# Patient Record
Sex: Female | Born: 1950 | Race: White | Hispanic: No | Marital: Married | Smoking: Never smoker
Health system: Southern US, Community
[De-identification: ages and names within clinical notes are randomized; demographics above are authoritative.]

---

## 2000-02-19 ENCOUNTER — Encounter: Payer: Self-pay | Admitting: Family Medicine

## 2000-02-19 ENCOUNTER — Encounter: Admission: RE | Admit: 2000-02-19 | Discharge: 2000-02-19 | Payer: Self-pay | Admitting: Family Medicine

## 2001-02-25 ENCOUNTER — Encounter: Admission: RE | Admit: 2001-02-25 | Discharge: 2001-02-25 | Payer: Self-pay | Admitting: Emergency Medicine

## 2001-02-25 ENCOUNTER — Encounter: Payer: Self-pay | Admitting: Emergency Medicine

## 2003-03-15 ENCOUNTER — Encounter (INDEPENDENT_AMBULATORY_CARE_PROVIDER_SITE_OTHER): Payer: Self-pay | Admitting: *Deleted

## 2003-03-15 ENCOUNTER — Ambulatory Visit (HOSPITAL_COMMUNITY): Admission: RE | Admit: 2003-03-15 | Discharge: 2003-03-15 | Payer: Self-pay | Admitting: Gastroenterology

## 2005-08-14 ENCOUNTER — Other Ambulatory Visit: Admission: RE | Admit: 2005-08-14 | Discharge: 2005-08-14 | Payer: Self-pay | Admitting: Emergency Medicine

## 2005-12-04 ENCOUNTER — Encounter: Admission: RE | Admit: 2005-12-04 | Discharge: 2005-12-04 | Payer: Self-pay | Admitting: Emergency Medicine

## 2005-12-18 ENCOUNTER — Encounter: Admission: RE | Admit: 2005-12-18 | Discharge: 2006-03-18 | Payer: Self-pay | Admitting: Emergency Medicine

## 2007-01-22 ENCOUNTER — Encounter: Admission: RE | Admit: 2007-01-22 | Discharge: 2007-01-22 | Payer: Self-pay | Admitting: Emergency Medicine

## 2007-05-14 ENCOUNTER — Encounter: Admission: RE | Admit: 2007-05-14 | Discharge: 2007-05-14 | Payer: Self-pay | Admitting: Family Medicine

## 2008-01-12 ENCOUNTER — Other Ambulatory Visit: Admission: RE | Admit: 2008-01-12 | Discharge: 2008-01-12 | Payer: Self-pay | Admitting: Emergency Medicine

## 2009-02-21 ENCOUNTER — Encounter: Admission: RE | Admit: 2009-02-21 | Discharge: 2009-02-21 | Payer: Self-pay | Admitting: Emergency Medicine

## 2010-08-17 NOTE — Op Note (Signed)
Alisha Jones, KASSEL                            ACCOUNT NO.:  [id number]   MEDICAL RECORD NO.:  [id number]                   PATIENT TYPE:  AMB   LOCATION:  ENDO                                 FACILITY:  [facility]   PHYSICIAN:  James L. Malon Kindle., M.D.          DATE OF BIRTH:  May 30, 1950   DATE OF PROCEDURE:  03/15/2003  DATE OF DISCHARGE:                                 OPERATIVE REPORT   PROCEDURE PERFORMED:  Colonoscopy with polypectomy.   ENDOSCOPIST:  Llana Aliment. Edwards, M.D.   MEDICATIONS:  Fentanyl 50 mcg, Versed 10 mg IV.   INDICATIONS FOR PROCEDURE:  Colon cancer screening.   INSTRUMENT USED:  Pediatric colonoscope.   DESCRIPTION OF PROCEDURE:  The procedure had been explained to the patient  and consent obtained.  With the patient in the left lateral decubitus  position, the pediatric adjustable Olympus colonoscope was inserted and  advanced.  We were able to advance easily to the cecum using a little  abdominal pressure.  The ileocecal valve and appendiceal orifice were seen.  The scope was withdrawn and the cecum, ascending colon, hepatic flexure,  transverse colon, descending and sigmoid colon were seen well.  In the  midsigmoid at approximately 30 cm a 0.75 cm sessile polyp was encountered  and removed with a snare and sucked through the scope.  There was no  bleeding.  No other polyps were seen in the sigmoid colon or in the rectum.  The scope was withdrawn.  The patient tolerated the procedure well.   ASSESSMENT:  Sigmoid colon poly snared, 211.3.   PLAN:  Check pathology.  Routine postpolypectomy instructions.                                               James L. Malon Kindle., M.D.    Waldron Session  D:  03/15/2003  T:  03/15/2003  Job:  [id number]   cc:   Oley Balm. Georgina Pillion, M.D.  [address]  [place]  [place] [address]  Fax: [phone]

## 2011-04-03 ENCOUNTER — Other Ambulatory Visit: Payer: Self-pay | Admitting: Family Medicine

## 2011-04-03 DIAGNOSIS — Z1231 Encounter for screening mammogram for malignant neoplasm of breast: Secondary | ICD-10-CM

## 2011-04-19 ENCOUNTER — Ambulatory Visit
Admission: RE | Admit: 2011-04-19 | Discharge: 2011-04-19 | Disposition: A | Discharge status: Home / Self Care | Payer: 59 | Source: Ambulatory Visit | Attending: Family Medicine | Admitting: Family Medicine

## 2011-04-19 DIAGNOSIS — Z1231 Encounter for screening mammogram for malignant neoplasm of breast: Secondary | ICD-10-CM

## 2012-12-03 ENCOUNTER — Other Ambulatory Visit (HOSPITAL_COMMUNITY)
Admission: RE | Admit: 2012-12-03 | Discharge: 2012-12-03 | Disposition: A | Discharge status: Home / Self Care | Payer: 59 | Source: Ambulatory Visit | Attending: Family Medicine | Admitting: Family Medicine

## 2012-12-03 ENCOUNTER — Other Ambulatory Visit: Payer: Self-pay | Admitting: Family Medicine

## 2012-12-03 DIAGNOSIS — R87810 Cervical high risk human papillomavirus (HPV) DNA test positive: Secondary | ICD-10-CM | POA: Insufficient documentation

## 2012-12-03 DIAGNOSIS — Z124 Encounter for screening for malignant neoplasm of cervix: Secondary | ICD-10-CM | POA: Insufficient documentation

## 2012-12-03 DIAGNOSIS — Z1231 Encounter for screening mammogram for malignant neoplasm of breast: Secondary | ICD-10-CM

## 2012-12-03 DIAGNOSIS — Z1151 Encounter for screening for human papillomavirus (HPV): Secondary | ICD-10-CM | POA: Insufficient documentation

## 2012-12-21 ENCOUNTER — Ambulatory Visit
Admission: RE | Admit: 2012-12-21 | Discharge: 2012-12-21 | Disposition: A | Discharge status: Home / Self Care | Payer: 59 | Source: Ambulatory Visit | Attending: Family Medicine | Admitting: Family Medicine

## 2012-12-21 DIAGNOSIS — Z1231 Encounter for screening mammogram for malignant neoplasm of breast: Secondary | ICD-10-CM

## 2014-02-22 ENCOUNTER — Other Ambulatory Visit (HOSPITAL_COMMUNITY)
Admission: RE | Admit: 2014-02-22 | Discharge: 2014-02-22 | Disposition: A | Discharge status: Home / Self Care | Payer: 59 | Source: Ambulatory Visit | Attending: Family Medicine | Admitting: Family Medicine

## 2014-02-22 ENCOUNTER — Other Ambulatory Visit: Payer: Self-pay | Admitting: Family Medicine

## 2014-02-22 DIAGNOSIS — Z1151 Encounter for screening for human papillomavirus (HPV): Secondary | ICD-10-CM | POA: Diagnosis present

## 2014-02-22 DIAGNOSIS — Z01411 Encounter for gynecological examination (general) (routine) with abnormal findings: Secondary | ICD-10-CM | POA: Insufficient documentation

## 2014-02-22 DIAGNOSIS — A630 Anogenital (venereal) warts: Secondary | ICD-10-CM | POA: Diagnosis present

## 2014-02-28 LAB — CYTOLOGY - PAP

## 2016-02-01 ENCOUNTER — Other Ambulatory Visit (HOSPITAL_COMMUNITY)
Admission: RE | Admit: 2016-02-01 | Discharge: 2016-02-01 | Disposition: A | Discharge status: Home / Self Care | Payer: 59 | Source: Ambulatory Visit | Attending: Family Medicine | Admitting: Family Medicine

## 2016-02-01 ENCOUNTER — Other Ambulatory Visit: Payer: Self-pay | Admitting: Family Medicine

## 2016-02-01 DIAGNOSIS — Z1151 Encounter for screening for human papillomavirus (HPV): Secondary | ICD-10-CM | POA: Diagnosis present

## 2016-02-01 DIAGNOSIS — Z124 Encounter for screening for malignant neoplasm of cervix: Secondary | ICD-10-CM | POA: Insufficient documentation

## 2016-02-02 LAB — CYTOLOGY - PAP
DIAGNOSIS: NEGATIVE
HPV (WINDOPATH): NOT DETECTED

## 2017-02-19 ENCOUNTER — Other Ambulatory Visit: Payer: Self-pay | Admitting: Family Medicine

## 2017-02-19 DIAGNOSIS — E782 Mixed hyperlipidemia: Secondary | ICD-10-CM | POA: Diagnosis not present

## 2017-02-19 DIAGNOSIS — Z0000 Encounter for general adult medical examination without abnormal findings: Secondary | ICD-10-CM | POA: Diagnosis not present

## 2017-02-19 DIAGNOSIS — Z1159 Encounter for screening for other viral diseases: Secondary | ICD-10-CM | POA: Diagnosis not present

## 2017-02-19 DIAGNOSIS — H04129 Dry eye syndrome of unspecified lacrimal gland: Secondary | ICD-10-CM | POA: Diagnosis not present

## 2017-02-19 DIAGNOSIS — Z1231 Encounter for screening mammogram for malignant neoplasm of breast: Secondary | ICD-10-CM

## 2017-02-19 DIAGNOSIS — Z23 Encounter for immunization: Secondary | ICD-10-CM | POA: Diagnosis not present

## 2017-02-19 DIAGNOSIS — M8580 Other specified disorders of bone density and structure, unspecified site: Secondary | ICD-10-CM | POA: Diagnosis not present

## 2017-03-19 ENCOUNTER — Ambulatory Visit
Admission: RE | Admit: 2017-03-19 | Discharge: 2017-03-19 | Disposition: A | Discharge status: Home / Self Care | Payer: Medicare Other | Source: Ambulatory Visit | Attending: Family Medicine | Admitting: Family Medicine

## 2017-03-19 DIAGNOSIS — Z1231 Encounter for screening mammogram for malignant neoplasm of breast: Secondary | ICD-10-CM

## 2017-06-18 DIAGNOSIS — M8588 Other specified disorders of bone density and structure, other site: Secondary | ICD-10-CM | POA: Diagnosis not present

## 2018-01-16 DIAGNOSIS — Z23 Encounter for immunization: Secondary | ICD-10-CM | POA: Diagnosis not present

## 2018-03-13 DIAGNOSIS — M8580 Other specified disorders of bone density and structure, unspecified site: Secondary | ICD-10-CM | POA: Diagnosis not present

## 2018-03-13 DIAGNOSIS — Z0000 Encounter for general adult medical examination without abnormal findings: Secondary | ICD-10-CM | POA: Diagnosis not present

## 2018-03-13 DIAGNOSIS — F411 Generalized anxiety disorder: Secondary | ICD-10-CM | POA: Diagnosis not present

## 2018-03-13 DIAGNOSIS — Z23 Encounter for immunization: Secondary | ICD-10-CM | POA: Diagnosis not present

## 2018-03-13 DIAGNOSIS — E782 Mixed hyperlipidemia: Secondary | ICD-10-CM | POA: Diagnosis not present

## 2018-03-13 DIAGNOSIS — H04129 Dry eye syndrome of unspecified lacrimal gland: Secondary | ICD-10-CM | POA: Diagnosis not present

## 2018-03-17 ENCOUNTER — Other Ambulatory Visit: Payer: Self-pay | Admitting: Family Medicine

## 2018-03-17 DIAGNOSIS — Z1231 Encounter for screening mammogram for malignant neoplasm of breast: Secondary | ICD-10-CM

## 2018-07-06 ENCOUNTER — Ambulatory Visit: Payer: Medicare Other

## 2018-10-12 DIAGNOSIS — G5601 Carpal tunnel syndrome, right upper limb: Secondary | ICD-10-CM | POA: Diagnosis not present

## 2018-10-13 ENCOUNTER — Other Ambulatory Visit: Payer: Self-pay

## 2018-10-13 ENCOUNTER — Encounter: Payer: Self-pay | Admitting: Neurology

## 2018-10-13 DIAGNOSIS — G5601 Carpal tunnel syndrome, right upper limb: Secondary | ICD-10-CM

## 2018-11-12 ENCOUNTER — Ambulatory Visit (INDEPENDENT_AMBULATORY_CARE_PROVIDER_SITE_OTHER): Payer: Medicare Other | Admitting: Neurology

## 2018-11-12 ENCOUNTER — Other Ambulatory Visit: Payer: Self-pay

## 2018-11-12 DIAGNOSIS — G5601 Carpal tunnel syndrome, right upper limb: Secondary | ICD-10-CM | POA: Diagnosis not present

## 2018-11-12 DIAGNOSIS — M79641 Pain in right hand: Secondary | ICD-10-CM

## 2018-11-12 NOTE — Procedures (Signed)
[facility]  [place]  [place], [place] [address] Tel: [phone] Fax:  [phone] Test Date:  11/12/2018  Patient: Alisha Jones DOB: 1950/05/20 Physician: Narda Amber, DO  Sex: Female Height: 5\' 7"  Ref Phys: Lavone Orn, MD  ID#: [id number] Temp: 33.0C Technician:    Patient Complaints: This is a 68 year old female referred for evaluation of right wrist pain and hand weakness.  NCV & EMG Findings: Extensive electrodiagnostic testing of the right upper extremity shows:  1. Right median, ulnar, and mixed palmar sensory responses are within normal limits. 2. Right median and ulnar motor responses are within normal limits. 3. There is no evidence of active or chronic motor axonal loss changes affecting any of the tested muscles.  Motor unit configuration and recruitment pattern is within normal limits.  Impression: This is a normal study of the right upper extremity.  In particular, there is no evidence of carpal tunnel syndrome or cervical radiculopathy.   ___________________________ Narda Amber, DO    Nerve Conduction Studies Anti Sensory Summary Table   Site NR Peak (ms) Norm Peak (ms) P-T Amp (V) Norm P-T Amp  Right Median Anti Sensory (2nd Digit)  33C  Wrist    3.2 <3.8 32.8 >10  Right Ulnar Anti Sensory (5th Digit)  33C  Wrist    2.8 <3.2 39.9 >5   Motor Summary Table   Site NR Onset (ms) Norm Onset (ms) O-P Amp (mV) Norm O-P Amp Site1 Site2 Delta-0 (ms) Dist (cm) Vel (m/s) Norm Vel (m/s)  Right Median Motor (Abd Poll Brev)  33C  Wrist    3.0 <4.0 11.9 >5 Elbow Wrist 4.3 28.0 65 >50  Elbow    7.3  10.2         Right Ulnar Motor (Abd Dig Minimi)  33C  Wrist    2.5 <3.1 11.0 >7 B Elbow Wrist 3.6 22.0 61 >50  B Elbow    6.1  10.2  A Elbow B Elbow 1.8 10.0 56 >50  A Elbow    7.9  9.9          Comparison Summary Table   Site NR Peak (ms) Norm Peak (ms) P-T Amp (V) Site1 Site2 Delta-P (ms) Norm Delta (ms)  Right Median/Ulnar  Palm Comparison (Wrist - 8cm)  33C  Median Palm    2.0 <2.2 60.8 Median Palm Ulnar Palm 0.2   Ulnar Palm    1.8 <2.2 21.0       EMG   Side Muscle Ins Act Fibs Psw Fasc Number Recrt Dur Dur. Amp Amp. Poly Poly. Comment  Right 1stDorInt Nml Nml Nml Nml Nml Nml Nml Nml Nml Nml Nml Nml N/A  Right Abd Poll Brev Nml Nml Nml Nml Nml Nml Nml Nml Nml Nml Nml Nml N/A  Right PronatorTeres Nml Nml Nml Nml Nml Nml Nml Nml Nml Nml Nml Nml N/A  Right Biceps Nml Nml Nml Nml Nml Nml Nml Nml Nml Nml Nml Nml N/A  Right Triceps Nml Nml Nml Nml Nml Nml Nml Nml Nml Nml Nml Nml N/A  Right Deltoid Nml Nml Nml Nml Nml Nml Nml Nml Nml Nml Nml Nml N/A      Waveforms:

## 2018-11-23 ENCOUNTER — Telehealth: Payer: Self-pay | Admitting: Neurology

## 2018-11-23 NOTE — Telephone Encounter (Signed)
Patient was calling in for the EMG results. Please call her back . Thanks!

## 2018-12-02 DIAGNOSIS — M25531 Pain in right wrist: Secondary | ICD-10-CM | POA: Diagnosis not present

## 2019-01-02 DIAGNOSIS — Z23 Encounter for immunization: Secondary | ICD-10-CM | POA: Diagnosis not present

## 2019-12-08 DIAGNOSIS — Z23 Encounter for immunization: Secondary | ICD-10-CM | POA: Diagnosis not present

## 2019-12-08 DIAGNOSIS — L821 Other seborrheic keratosis: Secondary | ICD-10-CM | POA: Diagnosis not present

## 2019-12-08 DIAGNOSIS — B079 Viral wart, unspecified: Secondary | ICD-10-CM | POA: Diagnosis not present

## 2020-02-10 DIAGNOSIS — Z23 Encounter for immunization: Secondary | ICD-10-CM | POA: Diagnosis not present

## 2020-08-09 ENCOUNTER — Ambulatory Visit (INDEPENDENT_AMBULATORY_CARE_PROVIDER_SITE_OTHER): Payer: Medicare Other | Admitting: Otolaryngology

## 2020-09-04 ENCOUNTER — Other Ambulatory Visit: Payer: Self-pay | Admitting: Family Medicine

## 2020-09-04 DIAGNOSIS — E2839 Other primary ovarian failure: Secondary | ICD-10-CM

## 2020-09-04 DIAGNOSIS — M8580 Other specified disorders of bone density and structure, unspecified site: Secondary | ICD-10-CM

## 2020-09-04 DIAGNOSIS — Z1231 Encounter for screening mammogram for malignant neoplasm of breast: Secondary | ICD-10-CM

## 2020-09-06 ENCOUNTER — Other Ambulatory Visit: Payer: Medicare Other

## 2020-09-07 ENCOUNTER — Other Ambulatory Visit: Payer: Self-pay

## 2020-09-07 ENCOUNTER — Ambulatory Visit
Admission: RE | Admit: 2020-09-07 | Discharge: 2020-09-07 | Disposition: A | Discharge status: Home / Self Care | Payer: Medicare Other | Source: Ambulatory Visit | Attending: Family Medicine | Admitting: Family Medicine

## 2020-09-07 DIAGNOSIS — M8580 Other specified disorders of bone density and structure, unspecified site: Secondary | ICD-10-CM

## 2020-09-07 DIAGNOSIS — E2839 Other primary ovarian failure: Secondary | ICD-10-CM

## 2020-10-30 ENCOUNTER — Other Ambulatory Visit: Payer: Self-pay

## 2020-10-30 ENCOUNTER — Ambulatory Visit
Admission: RE | Admit: 2020-10-30 | Discharge: 2020-10-30 | Disposition: A | Discharge status: Home / Self Care | Payer: Medicare Other | Source: Ambulatory Visit | Attending: Family Medicine | Admitting: Family Medicine

## 2020-10-30 DIAGNOSIS — Z1231 Encounter for screening mammogram for malignant neoplasm of breast: Secondary | ICD-10-CM

## 2021-09-20 ENCOUNTER — Other Ambulatory Visit: Payer: Self-pay | Admitting: Sports Medicine

## 2021-09-20 ENCOUNTER — Ambulatory Visit
Admission: RE | Admit: 2021-09-20 | Discharge: 2021-09-20 | Disposition: A | Discharge status: Home / Self Care | Payer: Medicare Other | Source: Ambulatory Visit | Attending: Sports Medicine | Admitting: Sports Medicine

## 2021-09-20 DIAGNOSIS — R52 Pain, unspecified: Secondary | ICD-10-CM

## 2021-12-11 ENCOUNTER — Other Ambulatory Visit: Payer: Self-pay | Admitting: Family Medicine

## 2021-12-11 DIAGNOSIS — Z1231 Encounter for screening mammogram for malignant neoplasm of breast: Secondary | ICD-10-CM

## 2021-12-27 ENCOUNTER — Ambulatory Visit
Admission: RE | Admit: 2021-12-27 | Discharge: 2021-12-27 | Disposition: A | Discharge status: Home / Self Care | Payer: Medicare Other | Source: Ambulatory Visit | Attending: Family Medicine | Admitting: Family Medicine

## 2021-12-27 DIAGNOSIS — Z1231 Encounter for screening mammogram for malignant neoplasm of breast: Secondary | ICD-10-CM

## 2022-09-05 ENCOUNTER — Other Ambulatory Visit: Payer: Self-pay | Admitting: Family Medicine

## 2022-09-05 DIAGNOSIS — M8589 Other specified disorders of bone density and structure, multiple sites: Secondary | ICD-10-CM

## 2022-09-12 ENCOUNTER — Ambulatory Visit
Admission: RE | Admit: 2022-09-12 | Discharge: 2022-09-12 | Disposition: A | Discharge status: Home / Self Care | Payer: Medicare Other | Source: Ambulatory Visit | Attending: Family Medicine | Admitting: Family Medicine

## 2022-09-12 DIAGNOSIS — M8589 Other specified disorders of bone density and structure, multiple sites: Secondary | ICD-10-CM

## 2023-01-15 ENCOUNTER — Other Ambulatory Visit: Payer: Self-pay | Admitting: Family Medicine

## 2023-01-15 DIAGNOSIS — Z1231 Encounter for screening mammogram for malignant neoplasm of breast: Secondary | ICD-10-CM

## 2023-03-04 ENCOUNTER — Ambulatory Visit: Payer: Medicare Other

## 2023-07-23 ENCOUNTER — Encounter: Payer: Self-pay | Admitting: Emergency Medicine

## 2023-07-23 ENCOUNTER — Ambulatory Visit: Admission: EM | Admit: 2023-07-23 | Discharge: 2023-07-23 | Disposition: A | Discharge status: Home / Self Care

## 2023-07-23 DIAGNOSIS — H6122 Impacted cerumen, left ear: Secondary | ICD-10-CM | POA: Diagnosis not present

## 2023-07-23 NOTE — ED Provider Notes (Signed)
 [facility]    CSN: [id number] Arrival date & time: 07/23/23  1444      History   Chief Complaint Chief Complaint  Patient presents with   Ear Fullness    HPI Alisha Jones is a 73 y.o. female.   Patient presents to clinic over left ear fullness.  She woke up this morning and felt like her left ear was underwater.  This has been present for the past 3 to 4 days.  She has been using Debrox without much relief.  Around 2 years ago she did have to have her ears manually irrigated and the wax removed.  Has not had any pain.  No fevers.  No drainage from the ear.  The history is provided by the patient and medical records.  Ear Fullness    History reviewed. No pertinent past medical history.  There are no active problems to display for this patient.   History reviewed. No pertinent surgical history.  OB History   No obstetric history on file.      Home Medications    Prior to Admission medications   Medication Sig Start Date End Date Taking? Authorizing Provider  calcium carbonate (OSCAL) 1500 (600 Ca) MG TABS tablet Take by mouth 2 (two) times daily with a meal.   Yes [provider]  Turmeric (QC TUMERIC COMPLEX PO) Take by mouth.   Yes [provider]    Family History History reviewed. No pertinent family history.  Social History Social History   Tobacco Use   Smoking status: Never    Passive exposure: Past   Smokeless tobacco: Never  Vaping Use   Vaping status: Never Used  Substance Use Topics   Drug use: Never     Allergies   Patient has no known allergies.   Review of Systems Review of Systems  Per HPI  Physical Exam Triage Vital Signs ED Triage Vitals [07/23/23 1545]  Encounter Vitals Group     BP 117/78     Systolic BP Percentile      Diastolic BP Percentile      Pulse Rate 64     Resp 16     Temp 97.9 F (36.6 C)     Temp Source Oral     SpO2 96 %     Weight      Height      Head  Circumference      Peak Flow      Pain Score 0     Pain Loc      Pain Education      Exclude from Growth Chart    No data found.  Updated Vital Signs BP 117/78 (BP Location: Left Arm)   Pulse 64   Temp 97.9 F (36.6 C) (Oral)   Resp 16   SpO2 96%   Visual Acuity Right Eye Distance:   Left Eye Distance:   Bilateral Distance:    Right Eye Near:   Left Eye Near:    Bilateral Near:     Physical Exam Vitals and nursing note reviewed.  Constitutional:      Appearance: Normal appearance.  HENT:     Head: Normocephalic and atraumatic.     Left Ear: There is impacted cerumen.     Ears:     Comments: Left ear with cerumen impaction obscuring tympanic membrane.  Right ear with significant wax also obscuring tympanic membrane, but not complete impaction.    Nose: Nose normal.  Mouth/Throat:     Mouth: Mucous membranes are moist.  Eyes:     Conjunctiva/sclera: Conjunctivae normal.  Cardiovascular:     Rate and Rhythm: Normal rate.  Pulmonary:     Effort: Pulmonary effort is normal. No respiratory distress.  Skin:    General: Skin is warm and dry.  Neurological:     General: No focal deficit present.     Mental Status: She is alert.  Psychiatric:        Mood and Affect: Mood normal.      UC Treatments / Results  Labs (all labs ordered are listed, but only abnormal results are displayed) Labs Reviewed - No data to display  EKG   Radiology No results found.  Procedures Procedures (including critical care time)  Medications Ordered in UC Medications - No data to display  Initial Impression / Assessment and Plan / UC Course  I have reviewed the triage vital signs and the nursing notes.  Pertinent labs & imaging results that were available during my care of the patient were reviewed by me and considered in my medical decision making (see chart for details).  Vitals in triage reviewed, patient is hemodynamically stable.  Left ear with cerumen impaction,  right ear with significant wax.  Staff to perform bilateral ear irrigation, afterwards, tympanic membranes are pearly gray with good cone of light bilaterally.  Advised continuation of Debrox drops.  No obvious otitis externa or otitis media.  Plan of care, follow-up care return precautions given, no questions at this time.     Final Clinical Impressions(s) / UC Diagnoses   Final diagnoses:  Impacted cerumen of left ear     Discharge Instructions      You can use the Debrox drops, 5 drops to both ears once daily to help prevent any wax buildup.  Return to clinic if you develop any pain, drainage, fever, or new concerning symptoms.     ED Prescriptions   None    PDMP not reviewed this encounter.   Harlow Lighter, Mashayla Lavin  N, FNP 07/23/23 1635

## 2023-07-23 NOTE — ED Triage Notes (Signed)
 Pt reports L ear fullness x3-4 days. States it feels blocked and muffled hearing on L side. States she has earwax impaction a few years ago. Has used debrox drops with no relief that last few days.

## 2023-07-23 NOTE — Discharge Instructions (Addendum)
 You can use the Debrox drops, 5 drops to both ears once daily to help prevent any wax buildup.  Return to clinic if you develop any pain, drainage, fever, or new concerning symptoms.

## 2023-07-31 ENCOUNTER — Ambulatory Visit
Admission: RE | Admit: 2023-07-31 | Discharge: 2023-07-31 | Disposition: A | Discharge status: Home / Self Care | Source: Ambulatory Visit | Attending: Family Medicine | Admitting: Family Medicine

## 2023-07-31 DIAGNOSIS — Z1231 Encounter for screening mammogram for malignant neoplasm of breast: Secondary | ICD-10-CM

## 2023-10-20 IMAGING — CR DG KNEE COMPLETE 4+V*R*
4 series · 4 of 4 positions shown · non-contrast
Comparison: None Available.

CLINICAL DATA: Right anterior knee pain for 2 weeks. No known
injury.

EXAM:
RIGHT KNEE - COMPLETE 4+ VIEW

[w knee ap right]
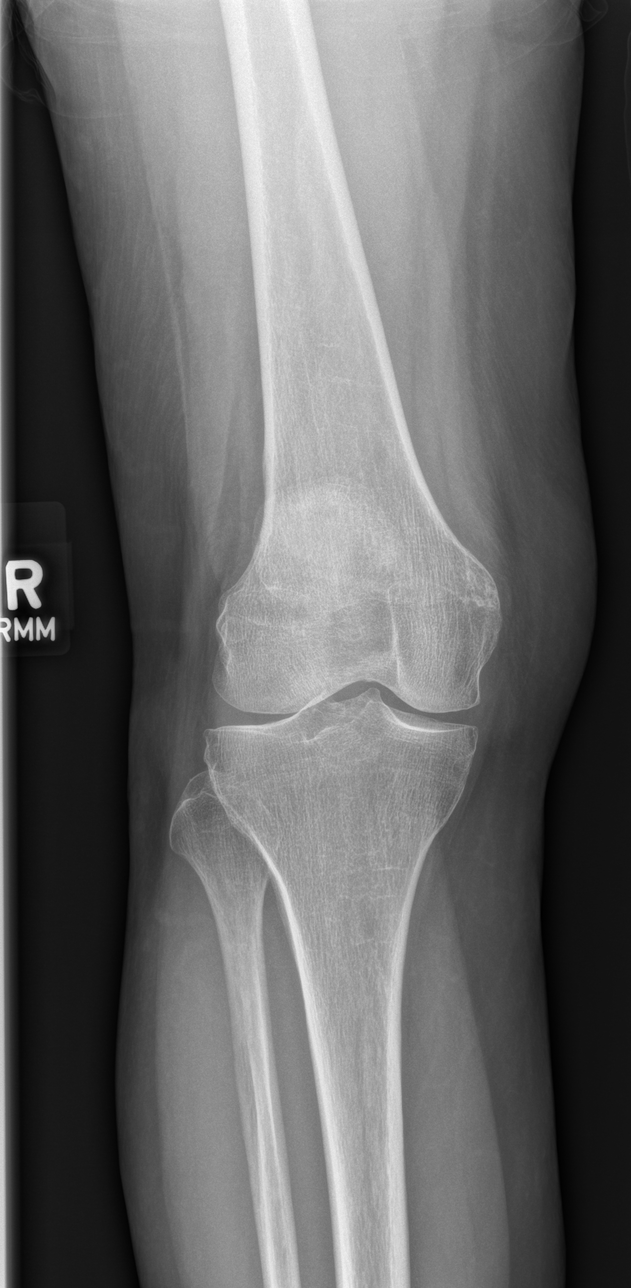

[w knee lat right]
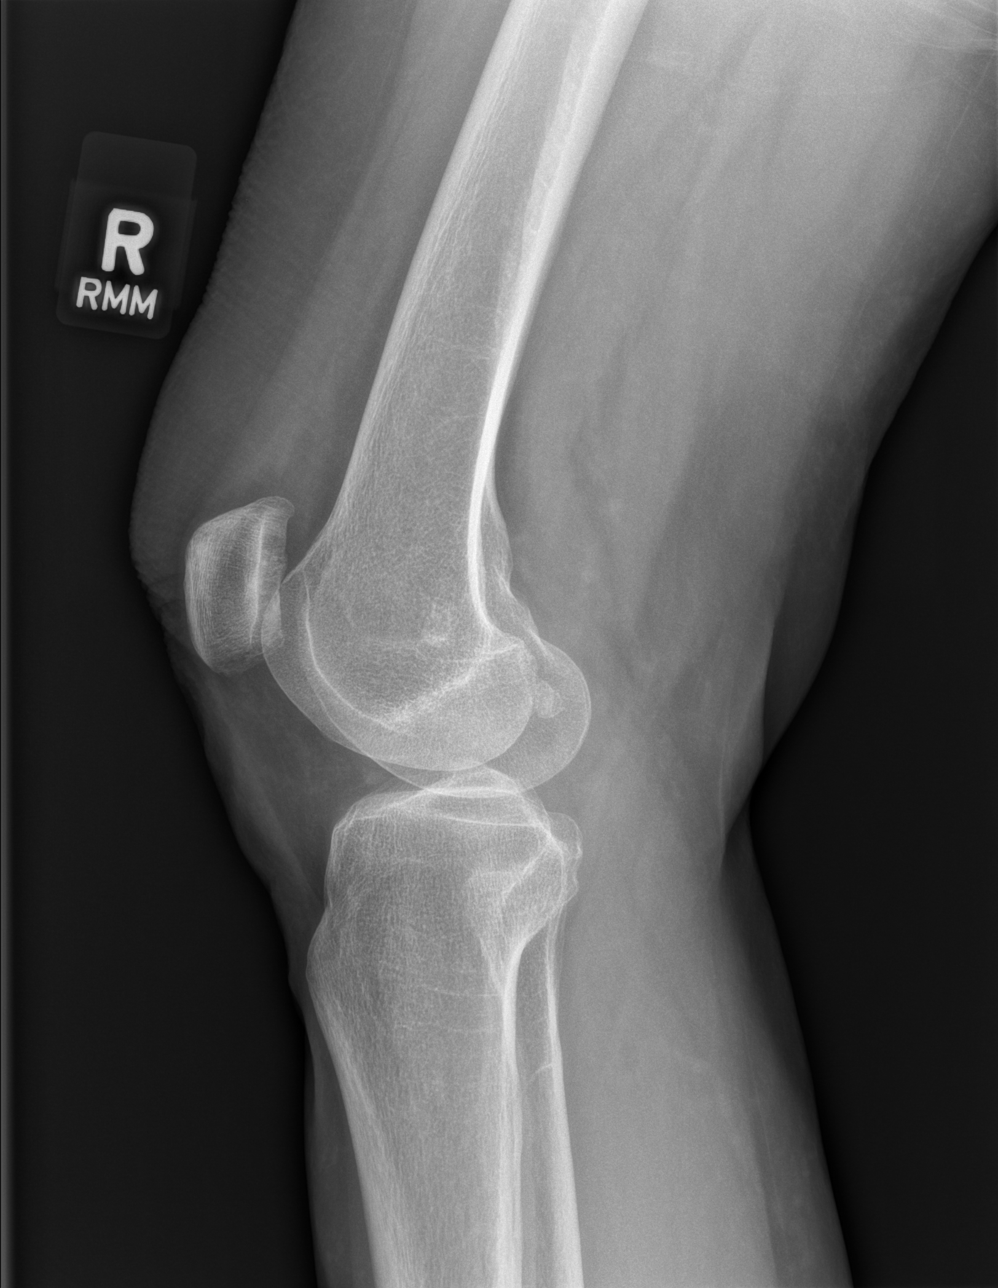

[w knee tunnel pa right]
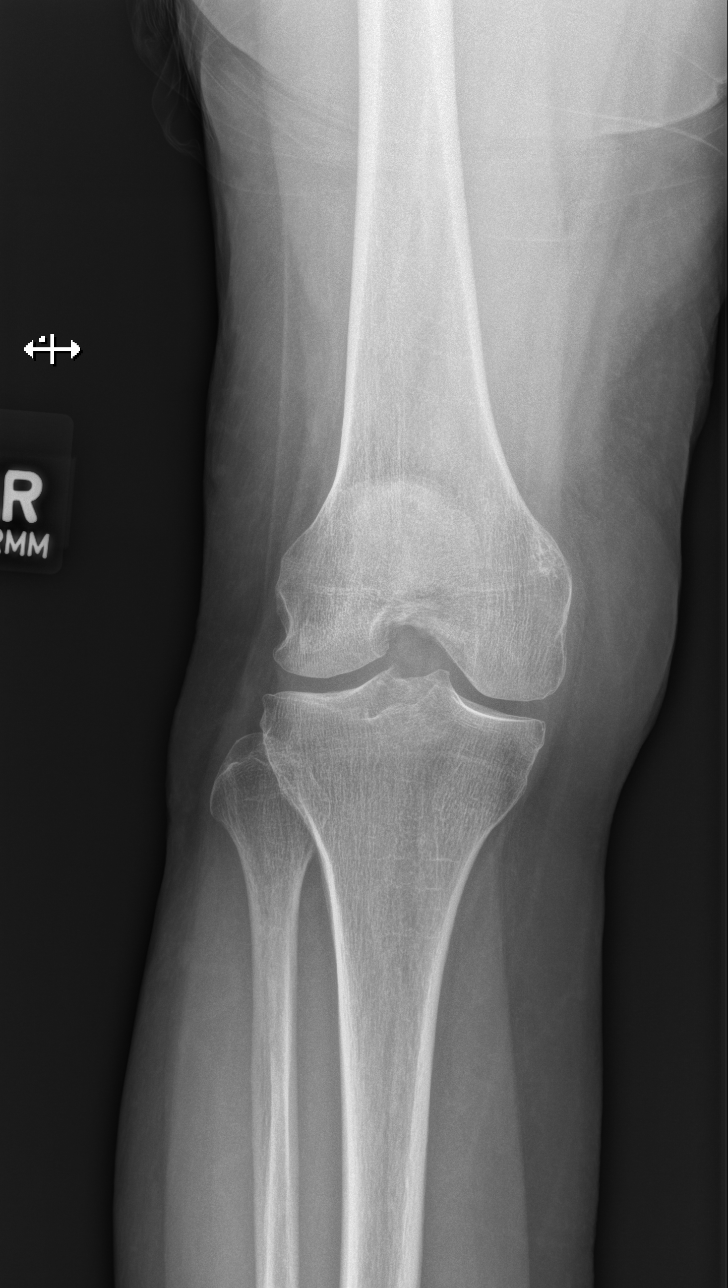

[x knee sunrise right]
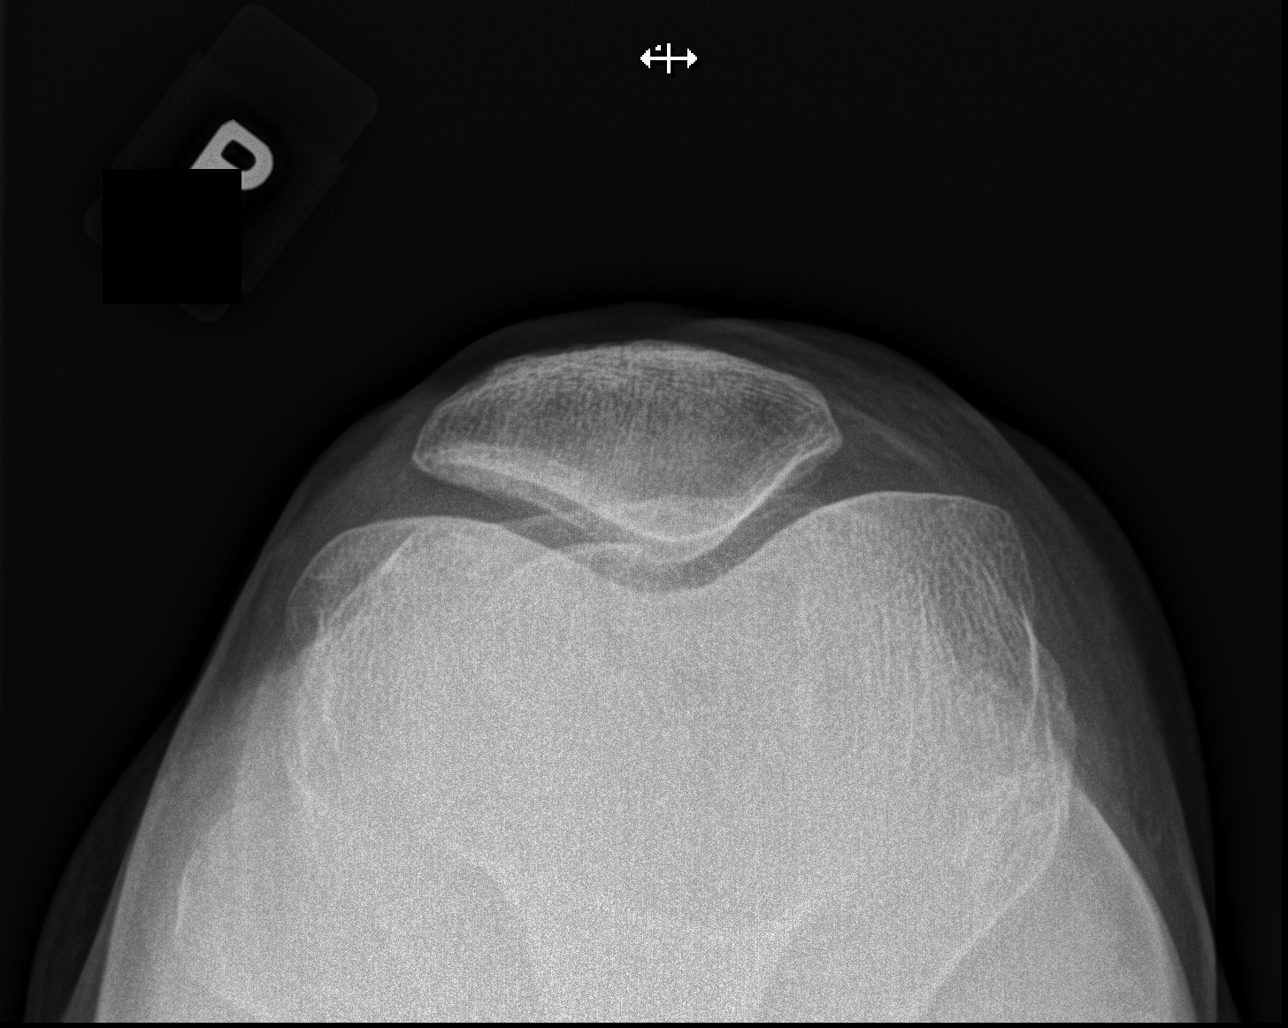

[4 of 4 positions shown; findings below may reference images not displayed]

FINDINGS: Normal alignment. The joint spaces are preserved. Trace peripheral
osteophytes and spurring of the tibial spine, less than typically
seen for age. No fracture. No erosion, periosteal reaction or focal
bone abnormality. Trace knee joint effusion. No focal soft tissue
abnormalities.
IMPRESSION: Trace osteoarthritis and trace knee joint effusion.
